# Patient Record
Sex: Male | Born: 1990 | Race: Black or African American | Hispanic: No | Marital: Single | Smoking: Current every day smoker
Health system: Southern US, Community
[De-identification: ages and names within clinical notes are randomized; demographics above are authoritative.]

## PROBLEM LIST (undated history)

## (undated) DIAGNOSIS — J45909 Unspecified asthma, uncomplicated: Secondary | ICD-10-CM

## (undated) DIAGNOSIS — J302 Other seasonal allergic rhinitis: Secondary | ICD-10-CM

---

## 2015-02-26 ENCOUNTER — Encounter (HOSPITAL_COMMUNITY): Payer: Self-pay | Admitting: Emergency Medicine

## 2015-02-26 ENCOUNTER — Emergency Department (HOSPITAL_COMMUNITY)
Admission: EM | Admit: 2015-02-26 | Discharge: 2015-02-26 | Disposition: A | Discharge status: Home / Self Care | Payer: Self-pay | Attending: Emergency Medicine | Admitting: Emergency Medicine

## 2015-02-26 ENCOUNTER — Emergency Department (HOSPITAL_COMMUNITY): Payer: Self-pay

## 2015-02-26 DIAGNOSIS — J069 Acute upper respiratory infection, unspecified: Secondary | ICD-10-CM | POA: Insufficient documentation

## 2015-02-26 DIAGNOSIS — Z720 Tobacco use: Secondary | ICD-10-CM | POA: Insufficient documentation

## 2015-02-26 DIAGNOSIS — Z880 Allergy status to penicillin: Secondary | ICD-10-CM | POA: Insufficient documentation

## 2015-02-26 DIAGNOSIS — J45909 Unspecified asthma, uncomplicated: Secondary | ICD-10-CM | POA: Insufficient documentation

## 2015-02-26 HISTORY — DX: Other seasonal allergic rhinitis: J30.2

## 2015-02-26 HISTORY — DX: Unspecified asthma, uncomplicated: J45.909

## 2015-02-26 MED ORDER — AZITHROMYCIN 250 MG PO TABS: 250.0000 mg | ORAL_TABLET | Freq: Every day | ORAL | Status: AC

## 2015-02-26 MED ORDER — ALBUTEROL SULFATE HFA 108 (90 BASE) MCG/ACT IN AERS
2.0000 | INHALATION_SPRAY | RESPIRATORY_TRACT | Status: DC | PRN
  Administered 2015-02-26: 2 via RESPIRATORY_TRACT
  Filled 2015-02-26: qty 6.7

## 2015-02-26 NOTE — ED Notes (Signed)
Pt states "I have been sick all my life.Tyler KitchenMarland KitchenI have sinus and asthma". C/o cough with CP and nasal congestion. Pt is a smoker.

## 2015-02-26 NOTE — ED Provider Notes (Signed)
CSN: [id number]     Arrival date & time 02/26/15  1408 History   First MD Initiated Contact with Patient 02/26/15 1429     Chief Complaint  Patient presents with  . URI     (Consider location/radiation/quality/duration/timing/severity/associated sxs/prior Treatment) HPI Comments: Patient presents to the ED with a chief complaint of nasal congestion, chest congestion and cough.  Patient states that he has had bad allergies and asthma for his whole life.  He states that his current symptoms recently started bothering him about 2 weeks ago.  He states that he has had a slight productive cough.  He does not use an inhaler.  He denies any fevers or chills.  He states that he experiences some chest pain when coughing, but denies any shortness of breath.  He is an everyday smoker.  He has not tried taking anything to alleviate his symptoms.  Patient also complains of right ankle pain.  States that he might have twisted it a few weeks ago while moving furniture.  He states that it hurts every once in a while.  He denies difficulty with ambulation.  Denies any popping or clicking.  The history is provided by the patient. No language interpreter was used.    Past Medical History  Diagnosis Date  . Seasonal allergies   . Asthma    History reviewed. No pertinent past surgical history. No family history on file. Social History  Substance Use Topics  . Smoking status: Current Every Day Smoker -- 0.10 packs/day    Types: Cigarettes  . Smokeless tobacco: None  . Alcohol Use: No    Review of Systems  Constitutional: Negative for fever and chills.  HENT: Positive for postnasal drip, rhinorrhea, sinus pressure, sneezing and sore throat.   Respiratory: Positive for cough. Negative for shortness of breath.   Cardiovascular: Negative for chest pain.  Gastrointestinal: Negative for nausea, vomiting, abdominal pain, diarrhea and constipation.  Genitourinary: Negative for dysuria.  Musculoskeletal:  Positive for arthralgias.      Allergies  Penicillins  Home Medications   Prior to Admission medications   Not on File   BP 127/80 mmHg  Pulse 84  Temp(Src) 98.6 F (37 C) (Oral)  Resp 16  Ht 5\' 11"  (1.803 m)  Wt 175 lb (79.379 kg)  BMI 24.42 kg/m2  SpO2 100% Physical Exam  Constitutional: He appears well-developed and well-nourished. No distress.  HENT:  Head: Normocephalic.  Right Ear: External ear normal.  Left Ear: External ear normal.  Mildly erythematous, no tonsillar exudate, no abscess, no stridor, uvula is midline  TMs clear bilaterally  Eyes: Conjunctivae and EOM are normal. Pupils are equal, round, and reactive to light.  Neck: Normal range of motion. Neck supple.  Cardiovascular: Normal rate, regular rhythm and normal heart sounds.  Exam reveals no gallop and no friction rub.   No murmur heard. Pulmonary/Chest: Effort normal and breath sounds normal. No stridor. No respiratory distress. He has no wheezes. He has no rales. He exhibits no tenderness.  CTAB  Abdominal: Soft. Bowel sounds are normal. He exhibits no distension. There is no tenderness.  Musculoskeletal: Normal range of motion. He exhibits no tenderness.  Right ankle non-tender to palpation, no bony abnormality or deformity, ROM and strength 5/5  Neurological: He is alert.  Skin: Skin is warm and dry. No rash noted. He is not diaphoretic.  Skin intact  Psychiatric: He has a normal mood and affect. His behavior is normal. Judgment and thought content normal.  Nursing  note and vitals reviewed.   ED Course  Procedures (including critical care time)  Imaging Review Dg Chest 2 View  02/26/2015   CLINICAL DATA:  Central chest pain and cough for 2 weeks.  EXAM: CHEST  2 VIEW  COMPARISON:  None.  FINDINGS: Cardiomediastinal silhouette is normal. Mediastinal contours appear intact.  There is no evidence of focal airspace consolidation, pleural effusion or pneumothorax.  Osseous structures are without  acute abnormality. Soft tissues are grossly normal.  IMPRESSION: No radiographic evidence of acute cardiopulmonary abnormality.   Electronically Signed   By: Ted Mcalpine M.D.   On: 02/26/2015 15:28   I have personally reviewed and evaluated these images and lab results as part of my medical decision-making.    MDM   Final diagnoses:  URI (upper respiratory infection)    Pt CXR negative for acute infiltrate. Patients symptoms are consistent with URI, likely viral etiology. Discussed that antibiotics are not indicated for viral infections. Pt will be discharged with symptomatic treatment.  Verbalizes understanding and is agreeable with plan. Pt is hemodynamically stable & in NAD prior to dc.  Given history of asthma and smoker, will give z-pak and inhaler.  Recommend f/u with ortho for ankle pain.  Doubt any fracture as patient ambulates without difficulty.  Could be tendonitis or mild sprain.    Roxy Horseman, PA-C 02/26/15 1538  Elwin Mocha, MD 02/26/15 [phone]

## 2015-02-26 NOTE — Discharge Instructions (Signed)
Upper Respiratory Infection, Adult An upper respiratory infection (URI) is also sometimes known as the common cold. The upper respiratory tract includes the nose, sinuses, throat, trachea, and bronchi. Bronchi are the airways leading to the lungs. Most people improve within 1 week, but symptoms can last up to 2 weeks. A residual cough may last even longer.  CAUSES Many different viruses can infect the tissues lining the upper respiratory tract. The tissues become irritated and inflamed and often become very moist. Mucus production is also common. A cold is contagious. You can easily spread the virus to others by oral contact. This includes kissing, sharing a glass, coughing, or sneezing. Touching your mouth or nose and then touching a surface, which is then touched by another person, can also spread the virus. SYMPTOMS  Symptoms typically develop 1 to 3 days after you come in contact with a cold virus. Symptoms vary from person to person. They may include:  Runny nose.  Sneezing.  Nasal congestion.  Sinus irritation.  Sore throat.  Loss of voice (laryngitis).  Cough.  Fatigue.  Muscle aches.  Loss of appetite.  Headache.  Low-grade fever. DIAGNOSIS  You might diagnose your own cold based on familiar symptoms, since most people get a cold 2 to 3 times a year. Your caregiver can confirm this based on your exam. Most importantly, your caregiver can check that your symptoms are not due to another disease such as strep throat, sinusitis, pneumonia, asthma, or epiglottitis. Blood tests, throat tests, and X-rays are not necessary to diagnose a common cold, but they may sometimes be helpful in excluding other more serious diseases. Your caregiver will decide if any further tests are required. RISKS AND COMPLICATIONS  You may be at risk for a more severe case of the common cold if you smoke cigarettes, have chronic heart disease (such as heart failure) or lung disease (such as asthma), or if  you have a weakened immune system. The very young and very old are also at risk for more serious infections. Bacterial sinusitis, middle ear infections, and bacterial pneumonia can complicate the common cold. The common cold can worsen asthma and chronic obstructive pulmonary disease (COPD). Sometimes, these complications can require emergency medical care and may be life-threatening. PREVENTION  The best way to protect against getting a cold is to practice good hygiene. Avoid oral or hand contact with people with cold symptoms. Wash your hands often if contact occurs. There is no clear evidence that vitamin C, vitamin E, echinacea, or exercise reduces the chance of developing a cold. However, it is always recommended to get plenty of rest and practice good nutrition. TREATMENT  Treatment is directed at relieving symptoms. There is no cure. Antibiotics are not effective, because the infection is caused by a virus, not by bacteria. Treatment may include:  Increased fluid intake. Sports drinks offer valuable electrolytes, sugars, and fluids.  Breathing heated mist or steam (vaporizer or shower).  Eating chicken soup or other clear broths, and maintaining good nutrition.  Getting plenty of rest.  Using gargles or lozenges for comfort.  Controlling fevers with ibuprofen or acetaminophen as directed by your caregiver.  Increasing usage of your inhaler if you have asthma. Zinc gel and zinc lozenges, taken in the first 24 hours of the common cold, can shorten the duration and lessen the severity of symptoms. Pain medicines may help with fever, muscle aches, and throat pain. A variety of non-prescription medicines are available to treat congestion and runny nose. Your caregiver   can make recommendations and may suggest nasal or lung inhalers for other symptoms.  HOME CARE INSTRUCTIONS   Only take over-the-counter or prescription medicines for pain, discomfort, or fever as directed by your  caregiver.  Use a warm mist humidifier or inhale steam from a shower to increase air moisture. This may keep secretions moist and make it easier to breathe.  Drink enough water and fluids to keep your urine clear or pale yellow.  Rest as needed.  Return to work when your temperature has returned to normal or as your caregiver advises. You may need to stay home longer to avoid infecting others. You can also use a face mask and careful hand washing to prevent spread of the virus. SEEK MEDICAL CARE IF:   After the first few days, you feel you are getting worse rather than better.  You need your caregiver's advice about medicines to control symptoms.  You develop chills, worsening shortness of breath, or brown or red sputum. These may be signs of pneumonia.  You develop yellow or brown nasal discharge or pain in the face, especially when you bend forward. These may be signs of sinusitis.  You develop a fever, swollen neck glands, pain with swallowing, or white areas in the back of your throat. These may be signs of strep throat. SEEK IMMEDIATE MEDICAL CARE IF:   You have a fever.  You develop severe or persistent headache, ear pain, sinus pain, or chest pain.  You develop wheezing, a prolonged cough, cough up blood, or have a change in your usual mucus (if you have chronic lung disease).  You develop sore muscles or a stiff neck. Document Released: 12/02/2000 Document Revised: 08/31/2011 Document Reviewed: 09/13/2013 Exit[employer] Patient Information 2015 [employer]. This information is not intended to replace advice given to you by your health care provider. Make sure you discuss any questions you have with your health care provider.  

## 2015-02-26 NOTE — ED Notes (Signed)
Patient states body aches, chest congestion, sinus congestion.   Patient states his chest hurts when he coughs only.   Patient states "i have real bad allergies, my sinuses bother me".   Patient states he has not tried anything over the counter to help.

## 2015-09-29 ENCOUNTER — Encounter (HOSPITAL_COMMUNITY): Payer: Self-pay | Admitting: *Deleted

## 2015-09-29 ENCOUNTER — Emergency Department (HOSPITAL_COMMUNITY)
Admission: EM | Admit: 2015-09-29 | Discharge: 2015-09-29 | Disposition: A | Discharge status: Home / Self Care | Payer: BLUE CROSS/BLUE SHIELD | Attending: Emergency Medicine | Admitting: Emergency Medicine

## 2015-09-29 DIAGNOSIS — K429 Umbilical hernia without obstruction or gangrene: Secondary | ICD-10-CM | POA: Insufficient documentation

## 2015-09-29 DIAGNOSIS — K645 Perianal venous thrombosis: Secondary | ICD-10-CM | POA: Diagnosis not present

## 2015-09-29 DIAGNOSIS — Z880 Allergy status to penicillin: Secondary | ICD-10-CM | POA: Insufficient documentation

## 2015-09-29 DIAGNOSIS — J45909 Unspecified asthma, uncomplicated: Secondary | ICD-10-CM | POA: Insufficient documentation

## 2015-09-29 DIAGNOSIS — F17210 Nicotine dependence, cigarettes, uncomplicated: Secondary | ICD-10-CM | POA: Insufficient documentation

## 2015-09-29 DIAGNOSIS — K649 Unspecified hemorrhoids: Secondary | ICD-10-CM | POA: Diagnosis present

## 2015-09-29 MED ORDER — DOCUSATE SODIUM 100 MG PO CAPS
100.0000 mg | ORAL_CAPSULE | Freq: Two times a day (BID) | ORAL | Status: AC
Start: 2015-09-29 — End: ?

## 2015-09-29 MED ORDER — HYDROCORTISONE 2.5 % RE CREA: TOPICAL_CREAM | RECTAL | Status: AC

## 2015-09-29 NOTE — ED Notes (Signed)
The pt is c/o hemorrhoid pain for 3 days.  No bleeding

## 2015-09-29 NOTE — ED Notes (Signed)
See EDP assessment 

## 2015-09-29 NOTE — ED Provider Notes (Signed)
History  By signing my name below, I, Tyler Welch, attest that this documentation has been prepared under the direction and in the presence of Sealed Air CorporationHeather Rahima Fleishman, PA-C. Electronically Signed: Karle PlumberJennifer Welch, ED Scribe. 09/29/2015. 11:21 PM.  Chief Complaint  Patient presents with  . Hemorrhoids   The history is provided by the patient and medical records. No language interpreter was used.    HPI Comments:  Tyler LawmanKaiser Mayse is a 25 y.o. male who presents to the Emergency Department complaining of hemorrhoids that appeared three days ago. He states he believes it is getting larger. Pt states he has hemorrhoids in the past but it usually goes away on its own. He reports some pain with bowel movements. He has not done anything to treat the symptoms. He denies modifying factors. He denies constipation, nausea, vomiting, abdominal pain, fever, chills. He denies any trauma or injury to the rectum. He denies bleeding from the hemorrhoids.   Past Medical History  Diagnosis Date  . Seasonal allergies   . Asthma    History reviewed. No pertinent past surgical history. No family history on file. Social History  Substance Use Topics  . Smoking status: Current Every Day Smoker -- 0.10 packs/day    Types: Cigarettes  . Smokeless tobacco: None  . Alcohol Use: No    Review of Systems A complete 10 system review of systems was obtained and all systems are negative except as noted in the HPI and PMH.   Allergies  Penicillins  Home Medications   Prior to Admission medications   Medication Sig Start Date End Date Taking? Authorizing Provider  azithromycin (ZITHROMAX Z-PAK) 250 MG tablet Take 1 tablet (250 mg total) by mouth daily. 500mg  PO day 1, then 250mg  PO days 205 02/26/15   Roxy Horsemanobert Browning, PA-C   Triage Vitals: BP 143/82 mmHg  Pulse 85  Temp(Src) 97.8 F (36.6 C) (Oral)  Resp 22  Ht 5\' 11"  (1.803 m)  Wt 163 lb 2 oz (73.993 kg)  BMI 22.76 kg/m2  SpO2 100% Physical Exam   Constitutional: He is oriented to person, place, and time. He appears well-developed and well-nourished.  HENT:  Head: Normocephalic and atraumatic.  Eyes: EOM are normal.  Neck: Normal range of motion.  Cardiovascular: Normal rate and regular rhythm.   Pulmonary/Chest: Effort normal and breath sounds normal.  Abdominal: Soft. There is no tenderness.  Small, soft, nontender umbilical hernia.  Genitourinary:  External thrombosed hemorrhoid at 9 o'clock position.  No surrounding erythema, edema, or warmth  Musculoskeletal: Normal range of motion.  Neurological: He is alert and oriented to person, place, and time.  Skin: Skin is warm and dry.  Psychiatric: He has a normal mood and affect. His behavior is normal.  Nursing note and vitals reviewed.   ED Course  Procedures (including critical care time) DIAGNOSTIC STUDIES: Oxygen Saturation is 100% on RA, normal by my interpretation.   COORDINATION OF CARE: 11:18 PM- Informed pt that the recommended treatment wound to be to incise the hemorrhoid, but pt declined. Will prescribe Anusol and stool softener. Pt verbalizes understanding and agrees to plan.  Medications - No data to display   MDM   Final diagnoses:  None  Patient presents today with a thrombosed external hemorrhoid.  Patient informed that the recommended treatment is incision and drainage.  Patient refused this treatment.  No signs of infection. Patient given Rx for Colace and Anusol HC.  Stable for discharge.  Return precautions given. I personally performed the services described in  this documentation, which was scribed in my presence. The recorded information has been reviewed and is accurate.     Santiago Glad, PA-C 09/30/15 2211  Gilda Crease, MD 09/30/15 [phone]

## 2015-11-16 ENCOUNTER — Encounter (HOSPITAL_COMMUNITY): Payer: Self-pay

## 2015-11-16 ENCOUNTER — Emergency Department (HOSPITAL_COMMUNITY)
Admission: EM | Admit: 2015-11-16 | Discharge: 2015-11-16 | Disposition: A | Discharge status: Home / Self Care | Payer: BLUE CROSS/BLUE SHIELD | Attending: Emergency Medicine | Admitting: Emergency Medicine

## 2015-11-16 DIAGNOSIS — R200 Anesthesia of skin: Secondary | ICD-10-CM | POA: Diagnosis present

## 2015-11-16 DIAGNOSIS — G510 Bell's palsy: Secondary | ICD-10-CM | POA: Insufficient documentation

## 2015-11-16 DIAGNOSIS — J45909 Unspecified asthma, uncomplicated: Secondary | ICD-10-CM | POA: Diagnosis not present

## 2015-11-16 DIAGNOSIS — Z79899 Other long term (current) drug therapy: Secondary | ICD-10-CM | POA: Insufficient documentation

## 2015-11-16 DIAGNOSIS — F17210 Nicotine dependence, cigarettes, uncomplicated: Secondary | ICD-10-CM | POA: Diagnosis not present

## 2015-11-16 MED ORDER — ACYCLOVIR 800 MG PO TABS: ORAL_TABLET | ORAL | Status: AC

## 2015-11-16 MED ORDER — PREDNISONE 10 MG PO TABS: ORAL_TABLET | ORAL | Status: AC

## 2015-11-16 MED ORDER — ARTIFICIAL TEARS OP OINT: TOPICAL_OINTMENT | Freq: Every evening | OPHTHALMIC | Status: AC | PRN

## 2015-11-16 NOTE — ED Notes (Signed)
Declined W/C at D/C and was escorted to lobby by RN. 

## 2015-11-16 NOTE — ED Notes (Signed)
Pt reports since Tuesday he has had numbness to Rt side of face . Pt also reports when he drinks the water runs oujt the RT side of face. Pt smile is asymmetrical with RT facial droop . Pt reports he is able to eat solid foods and drink water.

## 2015-11-16 NOTE — ED Provider Notes (Signed)
CSN: [id number]     Arrival date & time 11/16/15  1033 History  By signing my name below, I, Phillis HaggisGabriella Gaje, attest that this documentation has been prepared under the direction and in the presence of Langston MaskerKaren Sofia, [place]PA-C. Electronically Signed: Phillis HaggisGabriella Gaje, ED Scribe. 11/16/2015. 11:25 AM.   Chief Complaint  Patient presents with  . facial numbness-hx bells palsy    The history is provided by the patient. No language interpreter was used.  HPI Comments: Tyler LawmanKaiser Duesing is a 25 y.o. Male with a hx of Bell's Palsy who presents to the Emergency Department complaining of right sided facial numbness onset 4 days ago. He states that water will run out of his mouth on the right side. His last instance of Bell's Palsy was in 2011. He denies fever, chills, or speech difficulty. Pt is allergic to penicillins. Pt does not have a PCP.  Past Medical History  Diagnosis Date  . Seasonal allergies   . Asthma    History reviewed. No pertinent past surgical history. No family history on file. Social History  Substance Use Topics  . Smoking status: Current Every Day Smoker -- 0.10 packs/day    Types: Cigarettes  . Smokeless tobacco: None  . Alcohol Use: No    Review of Systems  Constitutional: Negative for fever and chills.  Neurological: Positive for numbness. Negative for speech difficulty.  All other systems reviewed and are negative.   Allergies  Penicillins  Home Medications   Prior to Admission medications   Medication Sig Start Date End Date Taking? Authorizing Provider  azithromycin (ZITHROMAX Z-PAK) 250 MG tablet Take 1 tablet (250 mg total) by mouth daily. 500mg  PO day 1, then 250mg  PO days 205 02/26/15   Roxy Horsemanobert Browning, PA-C  docusate sodium (COLACE) 100 MG capsule Take 1 capsule (100 mg total) by mouth every 12 (twelve) hours. 09/29/15   Heather Laisure, PA-C  hydrocortisone (ANUSOL-HC) 2.5 % rectal cream Apply rectally 2 times daily. 09/29/15   Heather Laisure, PA-C   BP 134/79 mmHg   Pulse 72  Temp(Src) 97.7 F (36.5 C) (Oral)  Resp 14  SpO2 100% Physical Exam  Constitutional: He is oriented to person, place, and time. He appears well-developed and well-nourished. No distress.  HENT:  Head: Normocephalic and atraumatic.  Eyes: Conjunctivae are normal.  Neck: Normal range of motion. Neck supple.  Cardiovascular: Normal rate and regular rhythm.   Musculoskeletal: Normal range of motion.  Neurological: He is alert and oriented to person, place, and time.  Right sided facial weakness including eyelid, eyebrow and mouth; consistent with Bell's Palsy  Skin: Skin is warm and dry.  Psychiatric: He has a normal mood and affect. His behavior is normal.  Nursing note and vitals reviewed.   ED Course  Procedures (including critical care time) DIAGNOSTIC STUDIES: Oxygen Saturation is 100% on RA, normal by my interpretation.    COORDINATION OF CARE: 11:26 AM-Discussed treatment plan with pt at bedside and pt agreed to plan.    Labs Review Labs Reviewed - No data to display  Imaging Review No results found. I have personally reviewed and evaluated these images and lab results as part of my medical decision-making.   EKG Interpretation None      MDM pt's exam consistent with bell's palsy.  I am not concerned for cva.   Pt counseled on Bell's and treatment .  Pt advised to follow up with neurology   Final diagnoses:  Bell's palsy   Meds ordered this encounter  Medications  . predniSONE (DELTASONE) 10 MG tablet    Sig: 6,5,4,3,2,1 tablet    Dispense:  21 tablet    Refill:  0    Order Specific Question:  Supervising Provider    AnswerArby Barrette [[id number]]  . acyclovir (ZOVIRAX) 800 MG tablet    Sig: One po qid    Dispense:  40 tablet    Refill:  0    Order Specific Question:  Supervising Provider    AnswerArby Barrette [[id number]]  . artificial tears (LACRILUBE) OINT ophthalmic ointment    Sig: Place into the right eye at bedtime as needed for  dry eyes.    Dispense:  3.5 g    Refill:  0    Order Specific Question:  Supervising Provider    Answer:  Arby Barrette [[id number]]  An After Visit Summary was printed and given to the patient.  Lonia Skinner [place], PA-C 11/16/15 1158  Tilden Fossa, MD 11/17/15 [phone]

## 2015-11-16 NOTE — ED Notes (Signed)
Patient here with 4 days of right sided facial numbness, unable to raise eyebrow. No other deficits.

## 2015-11-16 NOTE — Discharge Instructions (Signed)
Bell Palsy °Bell palsy is a condition in which the muscles on one side of the face become paralyzed. This often causes one side of the face to droop. It is a common condition and most people recover completely. °RISK FACTORS °Risk factors for Bell palsy include: °· Pregnancy. °· Diabetes. °· An infection by a virus, such as infections that cause cold sores. °CAUSES  °Bell palsy is caused by damage to or inflammation of a nerve in your face. It is unclear why this happens, but an infection by a virus may lead to it. Most of the time the reason it happens is unknown. °SIGNS AND SYMPTOMS  °Symptoms can range from mild to severe and can take place over a number of hours. Symptoms may include: °· Being unable to: °¨ Raise one or both eyebrows. °¨ Close one or both eyes. °¨ Feel parts of your face (facial numbness). °· Drooping of the eyelid and corner of the mouth. °· Weakness in the face. °· Paralysis of half your face. °· Loss of taste. °· Sensitivity to loud noises. °· Difficulty chewing. °· Tearing up of the affected eye. °· Dryness in the affected eye. °· Drooling. °· Pain behind one ear. °DIAGNOSIS  °Diagnosis of Bell palsy may include: °· A medical history and physical exam. °· An MRI. °· A CT scan. °· Electromyography (EMG). This is a test that checks how your nerves are working. °TREATMENT  °Treatment may include antiviral medicine to help shorten the length of the condition. Sometimes treatment is not needed and the symptoms go away on their own. °HOME CARE INSTRUCTIONS  °· Take medicines only as directed by your health care provider. °· Do facial massages and exercises as directed by your health care provider. °· If your eye is affected: °¨ Use moisturizing eye drops to prevent drying of your eye as directed by your health care provider. °¨ Protect your eye as directed by your health care provider. °SEEK MEDICAL CARE IF: °· Your symptoms do not get better or get worse. °· You are drooling. °· Your eye is red,  irritated, or hurts. °SEEK IMMEDIATE MEDICAL CARE IF:  °· Another part of your body feels weak or numb. °· You have difficulty swallowing. °· You have a fever along with symptoms of Bell palsy. °· You develop neck pain. °MAKE SURE YOU:  °· Understand these instructions. °· Will watch your condition. °· Will get help right away if you are not doing well or get worse. °  °This information is not intended to replace advice given to you by your health care provider. Make sure you discuss any questions you have with your health care provider. °  °Document Released: 06/08/2005 Document Revised: 02/27/2015 Document Reviewed: 09/15/2013 °[employer]vier Interactive Patient Education ©2016 [employer] ° °

## 2017-01-22 ENCOUNTER — Emergency Department (HOSPITAL_COMMUNITY): Payer: BLUE CROSS/BLUE SHIELD

## 2017-01-22 ENCOUNTER — Encounter (HOSPITAL_COMMUNITY): Payer: Self-pay | Admitting: *Deleted

## 2017-01-22 ENCOUNTER — Emergency Department (HOSPITAL_COMMUNITY)
Admission: EM | Admit: 2017-01-22 | Discharge: 2017-01-22 | Disposition: A | Discharge status: Home / Self Care | Payer: BLUE CROSS/BLUE SHIELD | Attending: Emergency Medicine | Admitting: Emergency Medicine

## 2017-01-22 DIAGNOSIS — S93602A Unspecified sprain of left foot, initial encounter: Secondary | ICD-10-CM

## 2017-01-22 DIAGNOSIS — Y929 Unspecified place or not applicable: Secondary | ICD-10-CM | POA: Diagnosis not present

## 2017-01-22 DIAGNOSIS — Z79899 Other long term (current) drug therapy: Secondary | ICD-10-CM | POA: Insufficient documentation

## 2017-01-22 DIAGNOSIS — J45909 Unspecified asthma, uncomplicated: Secondary | ICD-10-CM | POA: Diagnosis not present

## 2017-01-22 DIAGNOSIS — Y999 Unspecified external cause status: Secondary | ICD-10-CM | POA: Diagnosis not present

## 2017-01-22 DIAGNOSIS — S99922A Unspecified injury of left foot, initial encounter: Secondary | ICD-10-CM | POA: Diagnosis present

## 2017-01-22 DIAGNOSIS — W228XXA Striking against or struck by other objects, initial encounter: Secondary | ICD-10-CM | POA: Diagnosis not present

## 2017-01-22 DIAGNOSIS — Y9389 Activity, other specified: Secondary | ICD-10-CM | POA: Diagnosis not present

## 2017-01-22 DIAGNOSIS — F17210 Nicotine dependence, cigarettes, uncomplicated: Secondary | ICD-10-CM | POA: Diagnosis not present

## 2017-01-22 DIAGNOSIS — S9032XA Contusion of left foot, initial encounter: Secondary | ICD-10-CM | POA: Diagnosis not present

## 2017-01-22 MED ORDER — ACETAMINOPHEN 325 MG PO TABS
650.0000 mg | ORAL_TABLET | Freq: Once | ORAL | Status: AC
  Administered 2017-01-22: 650 mg via ORAL
  Filled 2017-01-22: qty 2

## 2017-01-22 NOTE — ED Provider Notes (Signed)
[facility]-EMERGENCY DEPT Provider Note   CSN: [id number] Arrival date & time: 01/22/17  [id number]  By signing my name below, I, Deland PrettySherilynn Knight, attest that this documentation has been prepared under the direction and in the presence of Graciella FreerLindsey Nicolus Ose, PA-C. Electronically Signed: Deland PrettySherilynn Knight, ED Scribe. 01/22/17. 10:00 AM.  History   Chief Complaint Chief Complaint  Patient presents with  . Foot Pain   The history is provided by the patient. No language interpreter was used.   HPI Comments: Vista LawmanKaiser Buikema is a 26 y.o. male who presents to the Emergency Department complaining of gradually worsening, "throbbing" left dorsal foot pain s/p an injury that occurred yesterday at 5:00pm. The pt states that he was at work when he dropped a bundle of plywood onto his foot. The pt is able to ambulate although this movement exacerbates his pain. He states that he applied a cool compress to the area with no relief. The pt has not tried any medications for his pain. He denies numbness, weakness, fever.   Past Medical History:  Diagnosis Date  . Asthma   . Seasonal allergies     There are no active problems to display for this patient.   History reviewed. No pertinent surgical history.     Home Medications    Prior to Admission medications   Medication Sig Start Date End Date Taking? Authorizing Provider  acyclovir (ZOVIRAX) 800 MG tablet One po qid 11/16/15   Elson AreasSofia, Leslie K, PA-C  artificial tears (LACRILUBE) OINT ophthalmic ointment Place into the right eye at bedtime as needed for dry eyes. 11/16/15   Elson AreasSofia, Leslie K, PA-C  azithromycin (ZITHROMAX Z-PAK) 250 MG tablet Take 1 tablet (250 mg total) by mouth daily. 500mg  PO day 1, then 250mg  PO days 205 02/26/15   Roxy HorsemanBrowning, Robert, PA-C  docusate sodium (COLACE) 100 MG capsule Take 1 capsule (100 mg total) by mouth every 12 (twelve) hours. 09/29/15   Santiago GladLaisure, Heather, PA-C  hydrocortisone (ANUSOL-HC) 2.5 % rectal cream Apply rectally 2 times daily.  09/29/15   Santiago GladLaisure, Heather, PA-C  predniSONE (DELTASONE) 10 MG tablet 6,5,4,3,2,1 tablet 11/16/15   Elson AreasSofia, Leslie K, PA-C    Family History History reviewed. No pertinent family history.  Social History Social History  Substance Use Topics  . Smoking status: Current Every Day Smoker    Packs/day: 0.10    Types: Cigarettes  . Smokeless tobacco: Not on file  . Alcohol use No     Allergies   Penicillins   Review of Systems Review of Systems  Constitutional: Negative for fever.  Musculoskeletal: Positive for myalgias.  Neurological: Negative for numbness.     Physical Exam Updated Vital Signs BP 122/79 (BP Location: Right Arm)   Pulse 98   Temp 97.9 F (36.6 C) (Oral)   Resp 18   SpO2 99%   Physical Exam  Constitutional: He appears well-developed and well-nourished.  HENT:  Head: Normocephalic and atraumatic.  Eyes: Conjunctivae and EOM are normal. Right eye exhibits no discharge. Left eye exhibits no discharge. No scleral icterus.  Cardiovascular:  Pulses:      Dorsalis pedis pulses are 2+ on the right side, and 2+ on the left side.  Pulmonary/Chest: Effort normal.  Musculoskeletal:  No tenderness to palpation of the left ankle. FROM of left ankle. Tenderness to palpation of the left 1st metatarsal with mild overlying ecchymosis. No deformity or crepitus. Flexion extension of toes intact without difficulty  Neurological: He is alert.  Sensation intact throughout all major nerve  distributions of the foot  Skin: Skin is warm and dry. Capillary refill takes less than 2 seconds.  Foot is not cool to the touch or dusky in appearance  Psychiatric: He has a normal mood and affect. His speech is normal and behavior is normal.  Nursing note and vitals reviewed.    ED Treatments / Results   DIAGNOSTIC STUDIES: Oxygen Saturation is 99% on RA, normal by my interpretation.   COORDINATION OF CARE: 9:57 AM-Discussed next steps with pt. Pt verbalized understanding and is  agreeable with the plan.   Labs (all labs ordered are listed, but only abnormal results are displayed) Labs Reviewed - No data to display  EKG  EKG Interpretation None       Radiology Dg Foot Complete Left  Result Date: 01/22/2017 CLINICAL DATA:  Dropped heavy object on left foot yesterday. Pain at left great toe EXAM: LEFT FOOT - COMPLETE 3+ VIEW COMPARISON:  None. FINDINGS: No acute bony abnormality. Specifically, no fracture, subluxation, or dislocation. Soft tissues are intact. Joint spaces are maintained. Normal bone mineralization. IMPRESSION: Negative. Electronically Signed   By: Charlett NoseKevin  Dover M.D.   On: 01/22/2017 10:01    Procedures Procedures (including critical care time)  Medications Ordered in ED Medications  acetaminophen (TYLENOL) tablet 650 mg (650 mg Oral Given 01/22/17 1009)     Initial Impression / Assessment and Plan / ED Course  I have reviewed the triage vital signs and the nursing notes.  Pertinent labs & imaging results that were available during my care of the patient were reviewed by me and considered in my medical decision making (see chart for details).     26 year old male who presents with left foot pain that began last night at 5:30 PM after he dropped some wood on the foot. Patient is afebrile, non-toxic appearing, sitting comfortably on examination table. Vital signs reviewed and stable. Patient is neurovascularly intact. Patient is ambulatory in the ED. Consider fracture versus dislocation versus sprain versus contusion. X-rays ordered at triage. Analgesics provided in the department.  X-ray reviewed. Negative for any acute fracture dislocation. Discussed results with patient. Symptoms likely result of contusion. We'll plan for symptomatic treatment. Conservative therapies discussed. Provided patient with a list of clinic resources to use if he does not have a PCP. Instructed to call them today to arrange follow-up in the next 24-48 hours.  Strict  eturn precautions discussed. Patient expresses understanding and agreement to plan.     Final Clinical Impressions(s) / ED Diagnoses   Final diagnoses:  Contusion of left foot, initial encounter  Sprain of left foot, initial encounter    New Prescriptions New Prescriptions   No medications on file   I personally performed the services described in this documentation, which was scribed in my presence. The recorded information has been reviewed and is accurate.    Maxwell CaulLayden, Bekka Qian A, PA-C 01/22/17 1044    Mancel BaleWentz, Elliott, MD 01/23/17 [phone]

## 2017-01-22 NOTE — ED Triage Notes (Signed)
Pt reports dropping wood on left foot yesterday and having pain. Ambulatory at triage.

## 2017-01-22 NOTE — Discharge Instructions (Signed)
Follow up with your Primary Care Doctor as needed. If you do not have one, use one listed in the paperwork.   You can take tylenol or ibuprofen as needed for pain.   Return to the Emergency Department immediately for any worsening pain, redness/swelling of the fiit, gray or blue color to the toes, numbness/weakness of toes or foot, difficulty walking or any other worsening or concerning symptoms.   RICE therapy:  Routine Care for injuries  Rest, Ice, Compression, Elevation (RICE)  Rest is needed to allow your body to heal. Routine activities can be resumed when comfortable. Injury tendons and bones can take up to 6 weeks to heal. Tendons are cordlike structures that attach muscles and bones.  Ice following an injury helps keep the swelling down and reduce the pain. Put ice in a plastic bag. Place a towel between your skin and the bag of ice. Leave the ice on for 15-20 minutes, 3-4 times a day. Do this while awake, for the first 24-48 hours. After that continue as directed by your caregiver.  Compression helps keep swelling down. It also gives support and helps with discomfort. If any lasting bandage has been applied, it should be removed and reapplied every 3-4 hours. It should not be applied tightly, but firmly enough to keep swelling down. Watch fingers or toes for swelling, discoloration, coldness, numbness or excessive pain. If any of these problems occur, removed the bandage and reapply loosely. Contact your caregiver if these problems continue.  Elevation helps reduce swelling and decrease your pain. With extremities such as the arms, hands, legs and feet, the injured area should be placed near or above the level of the heart if possible.

## 2017-03-04 ENCOUNTER — Emergency Department (HOSPITAL_COMMUNITY)
Admission: EM | Admit: 2017-03-04 | Discharge: 2017-03-04 | Disposition: A | Discharge status: Home / Self Care | Payer: BLUE CROSS/BLUE SHIELD | Attending: Emergency Medicine | Admitting: Emergency Medicine

## 2017-03-04 ENCOUNTER — Encounter (HOSPITAL_COMMUNITY): Payer: Self-pay | Admitting: *Deleted

## 2017-03-04 DIAGNOSIS — J029 Acute pharyngitis, unspecified: Secondary | ICD-10-CM | POA: Insufficient documentation

## 2017-03-04 LAB — RAPID STREP SCREEN (MED CTR MEBANE ONLY): Streptococcus, Group A Screen (Direct): NEGATIVE

## 2017-03-04 NOTE — ED Notes (Signed)
Pt's name called for a room x2.  No response.

## 2017-03-04 NOTE — ED Triage Notes (Signed)
To ED for eval of sore throat for the last couple of days. No airway difficulties.

## 2017-03-04 NOTE — ED Notes (Signed)
NO ANSWER IN LOBBY

## 2017-03-06 LAB — CULTURE, GROUP A STREP (THRC)

## 2018-03-06 ENCOUNTER — Emergency Department (HOSPITAL_COMMUNITY)
Admission: EM | Admit: 2018-03-06 | Discharge: 2018-03-07 | Disposition: A | Discharge status: Home / Self Care | Payer: BLUE CROSS/BLUE SHIELD | Attending: Emergency Medicine | Admitting: Emergency Medicine

## 2018-03-06 ENCOUNTER — Other Ambulatory Visit: Payer: Self-pay

## 2018-03-06 DIAGNOSIS — Z5321 Procedure and treatment not carried out due to patient leaving prior to being seen by health care provider: Secondary | ICD-10-CM | POA: Insufficient documentation

## 2018-03-06 DIAGNOSIS — R2242 Localized swelling, mass and lump, left lower limb: Secondary | ICD-10-CM | POA: Insufficient documentation

## 2018-03-06 NOTE — ED Notes (Signed)
Called for room x4 no reply. Not seen in lobby.

## 2018-03-06 NOTE — ED Triage Notes (Signed)
Patient c/o bilateral swelling of his feet, with the left being worse. Denies pain and injury.

## 2018-03-06 NOTE — ED Notes (Signed)
Called for patient x 3 with no response 

## 2018-03-07 NOTE — ED Notes (Signed)
03/07/2018, Called follow-up call, no answer. Was unable to complete call.

## 2018-06-18 IMAGING — DX DG FOOT COMPLETE 3+V*L*
3 series · 3 of 3 positions shown · non-contrast
Comparison: None.

CLINICAL DATA: Dropped heavy object on left foot yesterday. Pain at
left great toe

EXAM:
LEFT FOOT - COMPLETE 3+ VIEW

[x foot ap left]
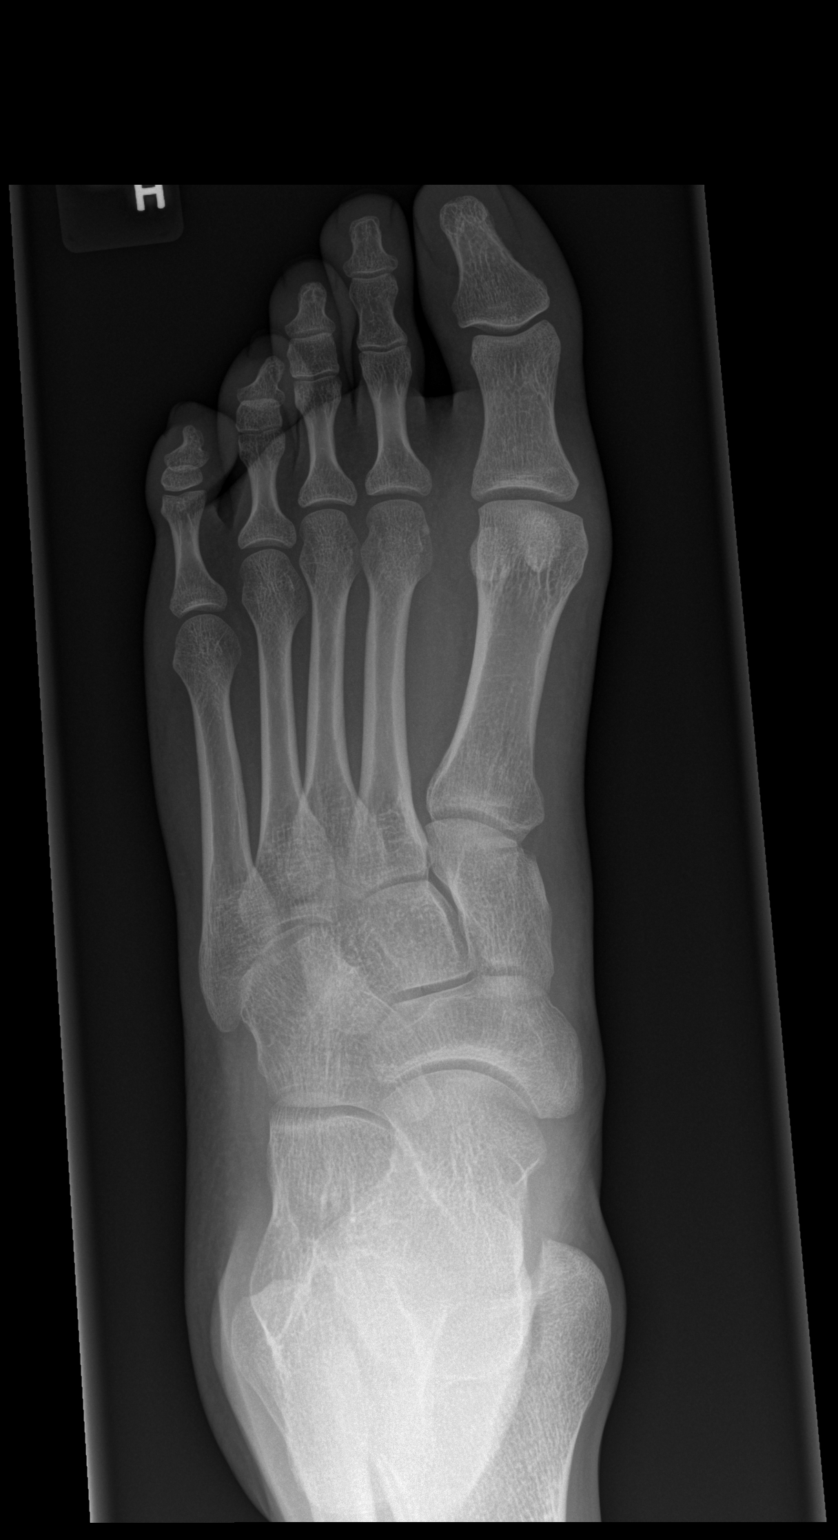

[x foot obl left]
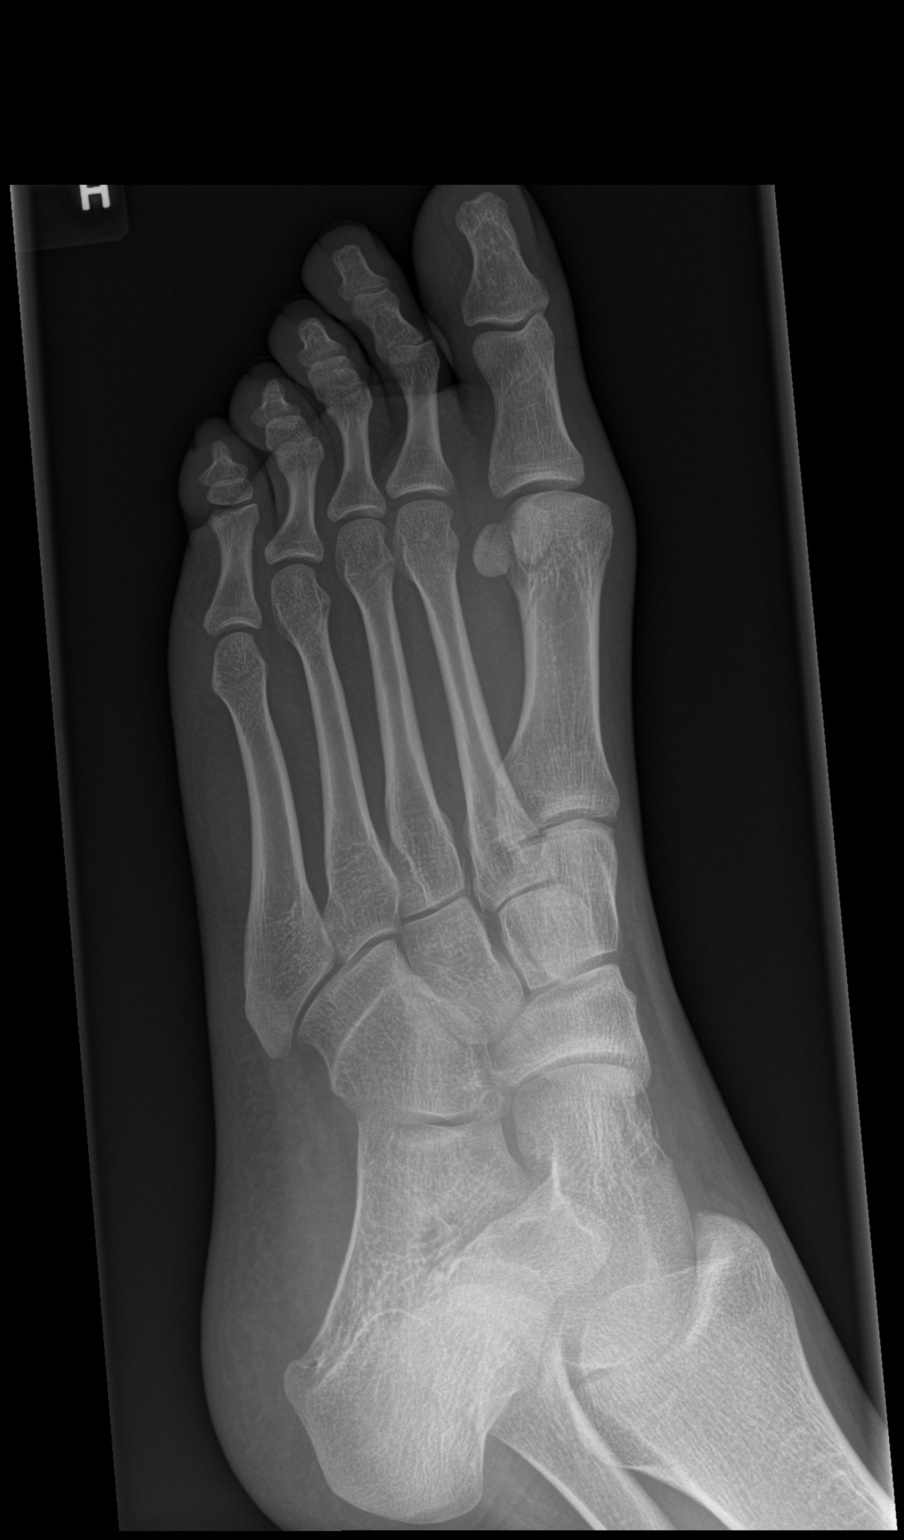

[x foot lat left]
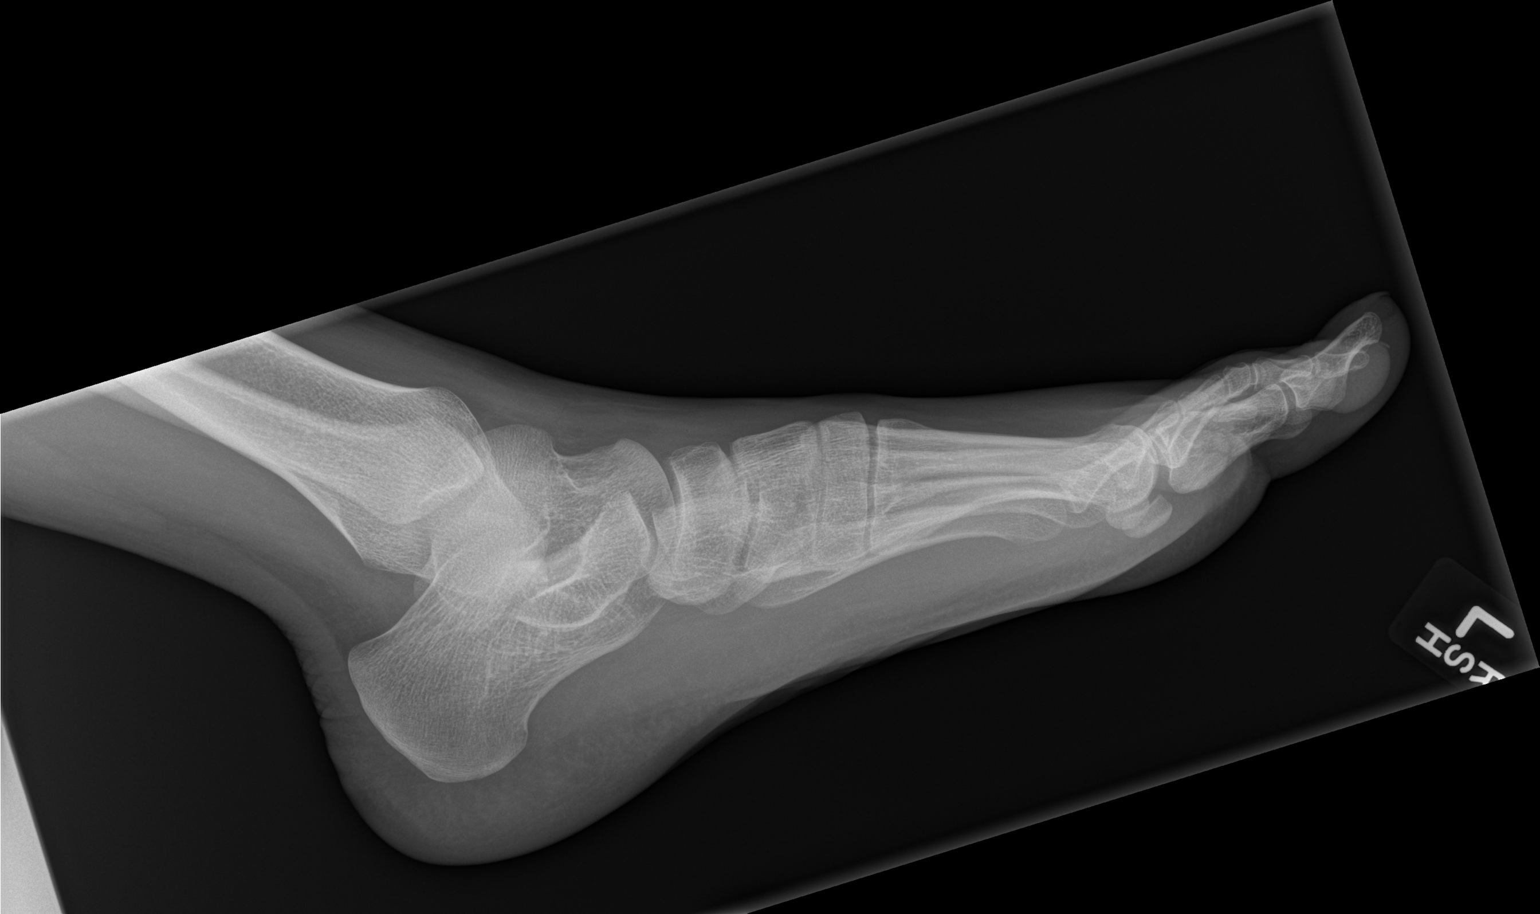

[3 of 3 positions shown; findings below may reference images not displayed]

FINDINGS: No acute bony abnormality. Specifically, no fracture, subluxation,
or dislocation. Soft tissues are intact. Joint spaces are
maintained. Normal bone mineralization.
IMPRESSION: Negative.
